# Patient Record
Sex: Female | Born: 1980 | Race: White | Hispanic: No | Marital: Married | Smoking: Never smoker
Health system: Southern US, Community
[De-identification: ages and names within clinical notes are randomized; demographics above are authoritative.]

## PROBLEM LIST (undated history)

## (undated) HISTORY — PX: APPENDECTOMY: SHX54

## (undated) HISTORY — PX: TONSILLECTOMY: SUR1361

## (undated) HISTORY — PX: DILATION AND CURETTAGE OF UTERUS: SHX78

---

## 2006-10-12 DIAGNOSIS — D689 Coagulation defect, unspecified: Secondary | ICD-10-CM | POA: Insufficient documentation

## 2020-10-12 ENCOUNTER — Other Ambulatory Visit: Payer: Self-pay

## 2020-10-12 ENCOUNTER — Encounter: Payer: Self-pay | Admitting: Podiatry

## 2020-10-12 ENCOUNTER — Ambulatory Visit: Payer: Managed Care, Other (non HMO) | Admitting: Podiatry

## 2020-10-12 DIAGNOSIS — L600 Ingrowing nail: Secondary | ICD-10-CM | POA: Diagnosis not present

## 2020-10-12 MED ORDER — GENTAMICIN SULFATE 0.1 % EX CREA: 1.0000 "application " | TOPICAL_CREAM | Freq: Two times a day (BID) | CUTANEOUS | 1 refills | Status: DC

## 2020-10-12 MED ORDER — DOXYCYCLINE HYCLATE 100 MG PO TABS: 100.0000 mg | ORAL_TABLET | Freq: Two times a day (BID) | ORAL | 0 refills | Status: DC

## 2020-10-12 NOTE — Patient Instructions (Signed)

## 2020-10-12 NOTE — Progress Notes (Signed)
   Subjective: Patient presents today for evaluation of pain to the medial border right great toe. Patient is concerned for possible ingrown nail. Patient presents today for further treatment and evaluation.  No past medical history on file.  Objective:  General: Well developed, nourished, in no acute distress, alert and oriented x3   Dermatology: Skin is warm, dry and supple bilateral. Medial border right great toe appears to be erythematous with evidence of an ingrowing nail. Pain on palpation noted to the border of the nail fold. The remaining nails appear unremarkable at this time. There are no open sores, lesions.  Vascular: Dorsalis Pedis artery and Posterior Tibial artery pedal pulses palpable. No lower extremity edema noted.   Neruologic: Grossly intact via light touch bilateral.  Musculoskeletal: Muscular strength within normal limits in all groups bilateral. Normal range of motion noted to all pedal and ankle joints.   Assesement: #1 Paronychia with ingrowing nail medial border right greater #2 Pain in toe  Plan of Care:  1. Patient evaluated.  2. Discussed treatment alternatives and plan of care. Explained nail avulsion procedure and post procedure course to patient. 3. Patient opted for permanent partial nail avulsion of the medial border right greater.  4. Prior to procedure, local anesthesia infiltration utilized using 3 ml of a 50:50 mixture of 2% plain lidocaine and 0.5% plain marcaine in a normal hallux block fashion and a betadine prep performed.  5. Partial permanent nail avulsion with chemical matrixectomy performed using 3x30sec applications of phenol followed by alcohol flush.  6. Light dressing applied. 7. Prescription for gentamicin cream applied daily  8. Prescription for doxycycline 100 mg 2 times daily #20  9. Return to clinic 2 weeks.  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  Felecia Shelling, DPM [facility]  Dr.  Felecia Shelling, DPM    [address] [place], [place] [address]                Office [phone]  Fax [phone]

## 2020-10-26 ENCOUNTER — Other Ambulatory Visit: Payer: Self-pay

## 2020-10-26 ENCOUNTER — Encounter: Payer: Self-pay | Admitting: Podiatry

## 2020-10-26 ENCOUNTER — Ambulatory Visit: Payer: Managed Care, Other (non HMO) | Admitting: Podiatry

## 2020-10-26 DIAGNOSIS — L600 Ingrowing nail: Secondary | ICD-10-CM

## 2020-10-28 NOTE — Progress Notes (Signed)
   Subjective: 40 y.o. female presents today status post permanent nail avulsion procedure of the medial border right great toe that was performed on 10/12/2020.  Patient states that she is feeling well.  There is improvement.  No new complaints at this time  No past medical history on file.  Objective: Skin is warm, dry and supple. Nail and respective nail fold appears to be healing appropriately. Open wound to the associated nail fold with a granular wound base and moderate amount of fibrotic tissue. Minimal drainage noted. Mild erythema around the periungual region likely due to phenol chemical matricectomy.  Assessment: #1 postop permanent partial nail avulsion medial border right great toe #2 open wound periungual nail fold of respective digit.   Plan of care: #1 patient was evaluated  #2 debridement of open wound was performed to the periungual border of the respective toe using a currette. Antibiotic ointment and Band-Aid was applied. #3 patient is to return to clinic on a PRN basis.  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 Felecia Shelling, DPM    [address]                                        [place], [place] [address]                Office [phone]  Fax [phone]

## 2020-10-30 ENCOUNTER — Ambulatory Visit: Payer: Managed Care, Other (non HMO) | Admitting: Podiatry

## 2020-12-03 ENCOUNTER — Encounter: Payer: Managed Care, Other (non HMO) | Admitting: Obstetrics and Gynecology

## 2020-12-06 ENCOUNTER — Other Ambulatory Visit: Payer: Self-pay

## 2020-12-06 ENCOUNTER — Ambulatory Visit (INDEPENDENT_AMBULATORY_CARE_PROVIDER_SITE_OTHER): Payer: Managed Care, Other (non HMO) | Admitting: Advanced Practice Midwife

## 2020-12-06 ENCOUNTER — Encounter: Payer: Self-pay | Admitting: Advanced Practice Midwife

## 2020-12-06 VITALS — BP 104/65 | Ht 61.0 in | Wt 110.0 lb

## 2020-12-06 DIAGNOSIS — Z01419 Encounter for gynecological examination (general) (routine) without abnormal findings: Secondary | ICD-10-CM | POA: Diagnosis not present

## 2020-12-06 DIAGNOSIS — N6323 Unspecified lump in the left breast, lower outer quadrant: Secondary | ICD-10-CM

## 2020-12-06 DIAGNOSIS — Z124 Encounter for screening for malignant neoplasm of cervix: Secondary | ICD-10-CM

## 2020-12-06 NOTE — Progress Notes (Signed)
Gynecology Annual Exam   PCP: Patient, No Pcp Per (Inactive)  Chief Complaint:  Chief Complaint  Patient presents with  . Breast Mass  . Annual Exam    History of Present Illness: Patient is a 40 y.o. G3P3003 presents for annual exam. The patient has complaint today of new breast lump in her left breast. She noticed the lump 3-4 weeks ago and it was tender at the time- less tender now although she is near her next period and has some general breast tenderness associated with that. She has no significant family history of breast or ovarian cancer.  The patient is new to [place] and previously had care in [place]. She has a history of normal PAP smears.   LMP: Patient's last menstrual period was 11/07/2020. Average Interval: regular, 28 days Duration of flow: 3-5 days Heavy Menses: no Clots: no Intermenstrual Bleeding: no Postcoital Bleeding: no Dysmenorrhea: no  The patient is sexually active. She currently uses vasectomy for contraception. She denies dyspareunia.  The patient does perform self breast exams.    The patient wears seatbelts: yes.   The patient has regular exercise: she lives an active lifestyle generally, she admits healthy diet, hydration and sleep.    The patient denies current symptoms of depression.    Review of Systems: Review of Systems  Constitutional: Negative for chills and fever.  HENT: Negative for congestion, ear discharge, ear pain, hearing loss, sinus pain and sore throat.   Eyes: Negative for blurred vision and double vision.  Respiratory: Negative for cough, shortness of breath and wheezing.   Cardiovascular: Negative for chest pain, palpitations and leg swelling.  Gastrointestinal: Negative for abdominal pain, blood in stool, constipation, diarrhea, heartburn, melena, nausea and vomiting.  Genitourinary: Negative for dysuria, flank pain, frequency, hematuria and urgency.  Musculoskeletal: Negative for back pain, joint pain and myalgias.   Skin: Negative for itching and rash.  Neurological: Negative for dizziness, tingling, tremors, sensory change, speech change, focal weakness, seizures, loss of consciousness, weakness and headaches.  Endo/Heme/Allergies: Negative for environmental allergies. Does not bruise/bleed easily.  Psychiatric/Behavioral: Negative for depression, hallucinations, memory loss, substance abuse and suicidal ideas. The patient is not nervous/anxious and does not have insomnia.   Breast: Positive for left breast lump  Past Medical History:  Patient Active Problem List   Diagnosis Date Noted  . 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 No pertinent surgical history.  Gynecologic History:  Patient's last menstrual period was 11/07/2020. Contraception: vasectomy Last Pap: 2 years ago in [place] Results were: no abnormalities per patient report  Obstetric History: G3P3003  Family History:  Family History  Problem Relation Age of Onset  . Heart disease Mother   . Heart disease Maternal Grandfather     Social History:  Social History   Socioeconomic History  . Marital status: Married    Spouse name: Not on file  . Number of children: Not on file  . Years of education: Not on file  . Highest education level: Not on file  Occupational History  . Not on file  Tobacco Use  . Smoking status: Never Smoker  . Smokeless tobacco: Never Used  Substance and Sexual Activity  . Alcohol use: Never  . Drug use: Never  . Sexual activity: Yes    Birth control/protection: None  Other Topics Concern  . Not on file  Social History Narrative  . Not on file   Social Determinants of Health  Financial Resource Strain: Not on file  Food Insecurity: Not on file  Transportation Needs: Not on file  Physical Activity: Not on file  Stress: Not on file  Social Connections: Not on file  Intimate Partner Violence: Not on file    Allergies:  No Known  Allergies  Medications: Prior to Admission medications   Medication Sig Start Date End Date Taking? Authorizing Provider  prochlorperazine (COMPAZINE) 5 MG tablet  02/09/18  Yes [provider]    Physical Exam Vitals: Blood pressure 104/65, height 5\' 1"  (1.549 m), weight 110 lb (49.9 kg), last menstrual period 11/07/2020.  General: NAD HEENT: normocephalic, anicteric Thyroid: no enlargement, no palpable nodules Pulmonary: No increased work of breathing, CTAB Cardiovascular: RRR, distal pulses 2+ Breast: Breast symmetrical, mildly tender, Right breast dense/fibrocystic breast tissue, Left breast dense/fibrocystic with very small nodule at 3 o:clock 9 cm from the nipple approximately 0.5 cm round and mobile, no skin or nipple retraction present, no nipple discharge.  No axillary or supraclavicular lymphadenopathy. Abdomen: NABS, soft, non-tender, non-distended.  Umbilicus without lesions.  No hepatomegaly, splenomegaly or masses palpable. No evidence of hernia  Genitourinary:  External: Normal external female genitalia.  Normal urethral meatus, normal Bartholin's and Skene's glands.    Vagina: Normal vaginal mucosa, no evidence of prolapse.    Cervix: Grossly normal in appearance, friable with PAP, no CMT  Uterus: Non-enlarged, mobile, normal contour.    Adnexa: ovaries non-enlarged, no adnexal masses  Rectal: deferred  Lymphatic: no evidence of inguinal lymphadenopathy Extremities: no edema, erythema, or tenderness Neurologic: Grossly intact Psychiatric: mood appropriate, affect full   Assessment: 40 y.o. G3P3003 routine annual exam  Plan: Problem List Items Addressed This Visit   None   Visit Diagnoses    Well woman exam with routine gynecological exam    -  Primary   Relevant Orders   IGP, Aptima HPV   Screening for cervical cancer       Relevant Orders   IGP, Aptima HPV   Lump in lower outer quadrant of left breast       Relevant Orders   MM DIAG BREAST TOMO  BILATERAL   24 BREAST LTD UNI LEFT INC AXILLA      ) STI screening  was offered and declined  2)  ASCCP guidelines and rationale discussed.  Patient prefers yearly. PAP today and may be ok with every 3 years going forward.  3) Contraception - the patient is currently using  vasectomy.  She is happy with her current form of contraception and plans to continue  4) Routine healthcare maintenance including cholesterol, diabetes screening discussed Declines   5) Left Breast lump: diagnostic mammogram/ultrasound ordered  5) Return in about 1 year (around 12/06/2021) for annual established gyn.   12/08/2021, CNM [facility] 12/06/2020, 9:32 AM

## 2020-12-06 NOTE — Patient Instructions (Signed)
Pap Test Why am I having this test? A Pap test, also called a Pap smear, is a screening test to check for signs of:  Cancer of the vagina, cervix, and uterus. The cervix is the lower part of the uterus that opens into the vagina.  Infection.  Changes that may be a sign that cancer is developing (precancerous changes). Women need this test on a regular basis. In general, you should have a Pap test every 3 years until you reach menopause or age 39. Women aged 30-60 may choose to have their Pap test done at the same time as an HPV (human papillomavirus) test every 5 years (instead of every 3 years). Your health care provider may recommend having Pap tests more or less often depending on your medical conditions and past Pap test results. What kind of sample is taken? Your health care provider will collect a sample of cells from the surface of your cervix. This will be done using a small cotton swab, plastic spatula, or brush. This sample is often collected during a pelvic exam, when you are lying on your back on an exam table with feet in footrests (stirrups). In some cases, fluids (secretions) from the cervix or vagina may also be collected.   How do I prepare for this test?  Be aware of where you are in your menstrual cycle. If you are menstruating on the day of the test, you may be asked to reschedule.  You may need to reschedule if you have a known vaginal infection on the day of the test.  Follow instructions from your health care provider about: ? Changing or stopping your regular medicines. Some medicines can cause abnormal test results, such as digitalis and tetracycline. ? Avoiding douching or taking a bath the day before or the day of the test. Tell a health care provider about:  Any allergies you have.  All medicines you are taking, including vitamins, herbs, eye drops, creams, and over-the-counter medicines.  Any blood disorders you have.  Any surgeries you have had.  Any  medical conditions you have.  Whether you are pregnant or may be pregnant. How are the results reported? Your test results will be reported as either abnormal or normal. A false-positive result can occur. A false positive is incorrect because it means that a condition is present when it is not. A false-negative result can occur. A false negative is incorrect because it means that a condition is not present when it is. What do the results mean? A normal test result means that you do not have signs of cancer of the vagina, cervix, or uterus. An abnormal result may mean that you have:  Cancer. A Pap test by itself is not enough to diagnose cancer. You will have more tests done in this case.  Precancerous changes in your vagina, cervix, or uterus.  Inflammation of the cervix.  An STD (sexually transmitted disease).  A fungal infection.  A parasite infection. Talk with your health care provider about what your results mean. Questions to ask your health care provider Ask your health care provider, or the department that is doing the test:  When will my results be ready?  How will I get my results?  What are my treatment options?  What other tests do I need?  What are my next steps? Summary  In general, women should have a Pap test every 3 years until they reach menopause or age 29.  Your health care provider will collect  a sample of cells from the surface of your cervix. This will be done using a small cotton swab, plastic spatula, or brush.  In some cases, fluids (secretions) from the cervix or vagina may also be collected. This information is not intended to replace advice given to you by your health care provider. Make sure you discuss any questions you have with your health care provider. Document Revised: 04/18/2020 Document Reviewed: 04/13/2020 Elsevier Patient Education  2021 Elsevier Inc. Health Maintenance, Female Adopting a healthy lifestyle and getting preventive  care are important in promoting health and wellness. Ask your health care provider about:  The right schedule for you to have regular tests and exams.  Things you can do on your own to prevent diseases and keep yourself healthy. What should I know about diet, weight, and exercise? Eat a healthy diet  Eat a diet that includes plenty of vegetables, fruits, low-fat dairy products, and lean protein.  Do not eat a lot of foods that are high in solid fats, added sugars, or sodium.   Maintain a healthy weight Body mass index (BMI) is used to identify weight problems. It estimates body fat based on height and weight. Your health care provider can help determine your BMI and help you achieve or maintain a healthy weight. Get regular exercise Get regular exercise. This is one of the most important things you can do for your health. Most adults should:  Exercise for at least 150 minutes each week. The exercise should increase your heart rate and make you sweat (moderate-intensity exercise).  Do strengthening exercises at least twice a week. This is in addition to the moderate-intensity exercise.  Spend less time sitting. Even light physical activity can be beneficial. Watch cholesterol and blood lipids Have your blood tested for lipids and cholesterol at 39 years of age, then have this test every 5 years. Have your cholesterol levels checked more often if:  Your lipid or cholesterol levels are high.  You are older than 39 years of age.  You are at high risk for heart disease. What should I know about cancer screening? Depending on your health history and family history, you may need to have cancer screening at various ages. This may include screening for:  Breast cancer.  Cervical cancer.  Colorectal cancer.  Skin cancer.  Lung cancer. What should I know about heart disease, diabetes, and high blood pressure? Blood pressure and heart disease  High blood pressure causes heart disease  and increases the risk of stroke. This is more likely to develop in people who have high blood pressure readings, are of African descent, or are overweight.  Have your blood pressure checked: ? Every 3-5 years if you are 28-96 years of age. ? Every year if you are 59 years old or older. Diabetes Have regular diabetes screenings. This checks your fasting blood sugar level. Have the screening done:  Once every three years after age 46 if you are at a normal weight and have a low risk for diabetes.  More often and at a younger age if you are overweight or have a high risk for diabetes. What should I know about preventing infection? Hepatitis B If you have a higher risk for hepatitis B, you should be screened for this virus. Talk with your health care provider to find out if you are at risk for hepatitis B infection. Hepatitis C Testing is recommended for:  Everyone born from [birth year] through 1964.  Anyone with known risk factors for  hepatitis C. Sexually transmitted infections (STIs)  Get screened for STIs, including gonorrhea and chlamydia, if: ? You are sexually active and are younger than 39 years of age. ? You are older than 39 years of age and your health care provider tells you that you are at risk for this type of infection. ? Your sexual activity has changed since you were last screened, and you are at increased risk for chlamydia or gonorrhea. Ask your health care provider if you are at risk.  Ask your health care provider about whether you are at high risk for HIV. Your health care provider may recommend a prescription medicine to help prevent HIV infection. If you choose to take medicine to prevent HIV, you should first get tested for HIV. You should then be tested every 3 months for as long as you are taking the medicine. Pregnancy  If you are about to stop having your period (premenopausal) and you may become pregnant, seek counseling before you get pregnant.  Take 400 to 800  micrograms (mcg) of folic acid every day if you become pregnant.  Ask for birth control (contraception) if you want to prevent pregnancy. Osteoporosis and menopause Osteoporosis is a disease in which the bones lose minerals and strength with aging. This can result in bone fractures. If you are 65 years old or older, or if you are at risk for osteoporosis and fractures, ask your health care provider if you should:  Be screened for bone loss.  Take a calcium or vitamin D supplement to lower your risk of fractures.  Be given hormone replacement therapy (HRT) to treat symptoms of menopause. Follow these instructions at home: Lifestyle  Do not use any products that contain nicotine or tobacco, such as cigarettes, e-cigarettes, and chewing tobacco. If you need help quitting, ask your health care provider.  Do not use street drugs.  Do not share needles.  Ask your health care provider for help if you need support or information about quitting drugs. Alcohol use  Do not drink alcohol if: ? Your health care provider tells you not to drink. ? You are pregnant, may be pregnant, or are planning to become pregnant.  If you drink alcohol: ? Limit how much you use to 0-1 drink a day. ? Limit intake if you are breastfeeding.  Be aware of how much alcohol is in your drink. In the [place] one drink equals one 12 oz bottle of beer (355 mL), one 5 oz glass of wine (148 mL), or one 1 oz glass of hard liquor (44 mL). General instructions  Schedule regular health, dental, and eye exams.  Stay current with your vaccines.  Tell your health care provider if: ? You often feel depressed. ? You have ever been abused or do not feel safe at home. Summary  Adopting a healthy lifestyle and getting preventive care are important in promoting health and wellness.  Follow your health care provider's instructions about healthy diet, exercising, and getting tested or screened for diseases.  Follow your health  care provider's instructions on monitoring your cholesterol and blood pressure. This information is not intended to replace advice given to you by your health care provider. Make sure you discuss any questions you have with your health care provider. Document Revised: 08/04/2018 Document Reviewed: 08/04/2018 Elsevier Patient Education  2021 Elsevier Inc.  

## 2020-12-11 LAB — IGP, APTIMA HPV
HPV Aptima: NEGATIVE
PAP Smear Comment: 0

## 2021-01-15 ENCOUNTER — Other Ambulatory Visit: Payer: Self-pay

## 2021-01-15 ENCOUNTER — Ambulatory Visit
Admission: RE | Admit: 2021-01-15 | Discharge: 2021-01-15 | Disposition: A | Discharge status: Home / Self Care | Payer: Managed Care, Other (non HMO) | Source: Ambulatory Visit | Attending: Advanced Practice Midwife | Admitting: Advanced Practice Midwife

## 2021-01-15 DIAGNOSIS — N6323 Unspecified lump in the left breast, lower outer quadrant: Secondary | ICD-10-CM | POA: Insufficient documentation

## 2022-07-07 ENCOUNTER — Ambulatory Visit
Admission: EM | Admit: 2022-07-07 | Discharge: 2022-07-07 | Disposition: A | Discharge status: Home / Self Care | Payer: Managed Care, Other (non HMO) | Attending: Internal Medicine | Admitting: Internal Medicine

## 2022-07-07 ENCOUNTER — Encounter: Payer: Self-pay | Admitting: Emergency Medicine

## 2022-07-07 DIAGNOSIS — R197 Diarrhea, unspecified: Secondary | ICD-10-CM

## 2022-07-07 DIAGNOSIS — J029 Acute pharyngitis, unspecified: Secondary | ICD-10-CM | POA: Diagnosis present

## 2022-07-07 LAB — GROUP A STREP BY PCR: Group A Strep by PCR: NOT DETECTED

## 2022-07-07 NOTE — ED Provider Notes (Signed)
[facility]    CSN: [id number] Arrival date & time: 07/07/22  1548      History   Chief Complaint Chief Complaint  Patient presents with   Sore Throat   Diarrhea   Fever    HPI Yvonne Evans is a 41 y.o. female who presents with onset of ST, diarrhea and fever up to  102  2 days ago. No fever this am. Has had today 4 diarrhea BM's in the past 2 days. Denies body aches or HA, N/V. Her appetite is decreased. She declined covid or flu test.     History reviewed. No pertinent past medical history.  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 Authorizing Provider  prochlorperazine (COMPAZINE) 5 MG tablet  02/09/18   [provider]    Family History Family History  Problem Relation Age of Onset   Heart disease Mother    Heart disease Maternal Grandfather     Social History Social History   Tobacco Use   Smoking status: Never   Smokeless tobacco: Never  Substance Use Topics   Alcohol use: Never   Drug use: Never     Allergies   Patient has no known allergies.   Review of Systems Review of Systems  Constitutional:  Positive for appetite change.  HENT:  Positive for sore throat. Negative for congestion, ear discharge, ear pain, postnasal drip and rhinorrhea.   Respiratory:  Negative for cough.   Gastrointestinal:  Positive for diarrhea. Negative for nausea and vomiting.     Physical Exam Triage Vital Signs ED Triage Vitals  Enc Vitals Group     BP 07/07/22 1645 116/83      Pulse Rate 07/07/22 1645 76     Resp 07/07/22 1645 16     Temp 07/07/22 1645 99 F (37.2 C)     Temp Source 07/07/22 1645 Oral     SpO2 07/07/22 1645 100 %     Weight --      Height --      Head Circumference --      Peak Flow --      Pain Score 07/07/22 1643 6     Pain Loc --      Pain Edu? --      Excl. in GC? --    No data found.  Updated Vital Signs BP 116/83 (BP Location: Right Arm)   Pulse 76   Temp 99 F (37.2 C) (Oral)   Resp 16   LMP 06/23/2022 (Approximate)   SpO2 100%   Visual Acuity Right Eye Distance:   Left Eye Distance:   Bilateral Distance:    Right Eye Near:   Left Eye Near:  Bilateral Near:     Physical Exam Physical Exam Vitals signs and nursing note reviewed.  Constitutional:      General: She is not in acute distress.    Appearance: Normal appearance. She is not ill-appearing, toxic-appearing or diaphoretic.  HENT:     Head: Normocephalic.     Right Ear: Tympanic membrane, ear canal and external ear normal.     Left Ear: Tympanic membrane, ear canal and external ear normal.     Nose: Nose normal.     Mouth/Throat: mild erythema, no exudates, uvula is mid line.     Mouth: Mucous membranes are moist.  Eyes:     General: No scleral icterus.       Right eye: No discharge.        Left eye: No discharge.     Conjunctiva/sclera: Conjunctivae normal.  Neck:     Musculoskeletal: Neck supple. No neck rigidity.  Cardiovascular:     Rate and Rhythm: Normal rate and regular rhythm.     Heart sounds: No murmur.  Pulmonary:     Effort: Pulmonary effort is normal.     Breath sounds: Normal breath sounds.  Abdominal:     General: Bowel sounds are normal. There is no distension.     Palpations: Abdomen is soft. There is no mass.     Tenderness: There is no abdominal tenderness. There is no guarding or rebound.     Hernia: No hernia is present.  Musculoskeletal: Normal range of motion.  Lymphadenopathy:     Cervical: No cervical adenopathy.   Skin:    General: Skin is warm and dry.     Coloration: Skin is not jaundiced.     Findings: No rash.  Neurological:     Mental Status: She is alert and oriented to person, place, and time.     Gait: Gait normal.  Psychiatric:        Mood and Affect: Mood normal.        Behavior: Behavior normal.        Thought Content: Thought content normal.        Judgment: Judgment normal.    UC Treatments / Results  Labs (all labs ordered are listed, but only abnormal results are displayed) Labs Reviewed  GROUP A STREP BY PCR    EKG   Radiology No results found.  Procedures Procedures (including critical care time)  Medications Ordered in UC Medications - No data to display  Initial Impression / Assessment and Plan / UC Course  I have reviewed the triage vital signs and the nursing notes.  Pertinent labs  results that were available during my care of the patient were reviewed by me and considered in my medical decision making (see chart for details).  Viral pharyngitis Diarrhea  Suportice care advised.  May continue BRAT diet  Final Clinical Impressions(s) / UC Diagnoses   Final diagnoses:  Viral upper respiratory tract infection  Diarrhea, unspecified type  Viral pharyngitis     Discharge Instructions      You may take the following supplements to help your immune system be stronger to fight this viral infection Take Quarcetin 500 mg three times a day x 7 days with Zinc 50 mg ones a day x 7 days. The quarcetin is an antiviral and anti-inflammatory supplement which helps open the zinc channels in the cell to absorb Zinc. Zinc helps decrease the virus load in your body. Take Melatonin 6-10 mg at bed time which also helps  support your immune system.  Also make sure to take Vit D 5,000 IU per day with a fatty meal and Vit C 5000 mg a day until you are completely better. To prevent viral illnesses your vitamin D should be between 60-80. Stay on Vitamin D 2,000  and C   1000 mg the rest of the season.       ED Prescriptions   None    PDMP not reviewed this encounter.   Garey Ham, Cordelia Poche 07/07/22 2017

## 2022-07-07 NOTE — ED Triage Notes (Signed)
Pt presents with sore throat, diarrhea, and fever x 2 days.

## 2022-07-07 NOTE — Discharge Instructions (Addendum)
You may take the following supplements to help your immune system be stronger to fight this viral infection Take Quarcetin 500 mg three times a day x 7 days with Zinc 50 mg ones a day x 7 days. The quarcetin is an antiviral and anti-inflammatory supplement which helps open the zinc channels in the cell to absorb Zinc. Zinc helps decrease the virus load in your body. Take Melatonin 6-10 mg at bed time which also helps support your immune system.  Also make sure to take Vit D 5,000 IU per day with a fatty meal and Vit C 5000 mg a day until you are completely better. To prevent viral illnesses your vitamin D should be between 60-80. Stay on Vitamin D 2,000  and C  1000 mg the rest of the season.

## 2022-08-09 ENCOUNTER — Inpatient Hospital Stay
Admission: RE | Admit: 2022-08-09 | Discharge: 2022-08-09 | Disposition: A | Discharge status: Home / Self Care | Payer: Managed Care, Other (non HMO) | Source: Ambulatory Visit

## 2022-08-29 ENCOUNTER — Ambulatory Visit: Payer: Managed Care, Other (non HMO) | Admitting: Obstetrics & Gynecology

## 2022-08-31 IMAGING — MG DIGITAL DIAGNOSTIC BILAT W/ TOMO W/ CAD
8 of 16 series · 8 of 40 positions shown · non-contrast
Comparison: Previous exam(s).

CLINICAL DATA: Palpable mass in the LEFT breast. Patient first
noted mass in October 2020. Mass is unchanged since first found.

EXAM:
DIGITAL DIAGNOSTIC BILATERAL MAMMOGRAM WITH TOMOSYNTHESIS AND CAD;
ULTRASOUND LEFT BREAST LIMITED
TECHNIQUE: Bilateral digital diagnostic mammography and breast tomosynthesis
was performed. The images were evaluated with computer-aided
detection.; Targeted ultrasound examination of the left breast was
performed

[L CC synth-2D (1 of 2)]
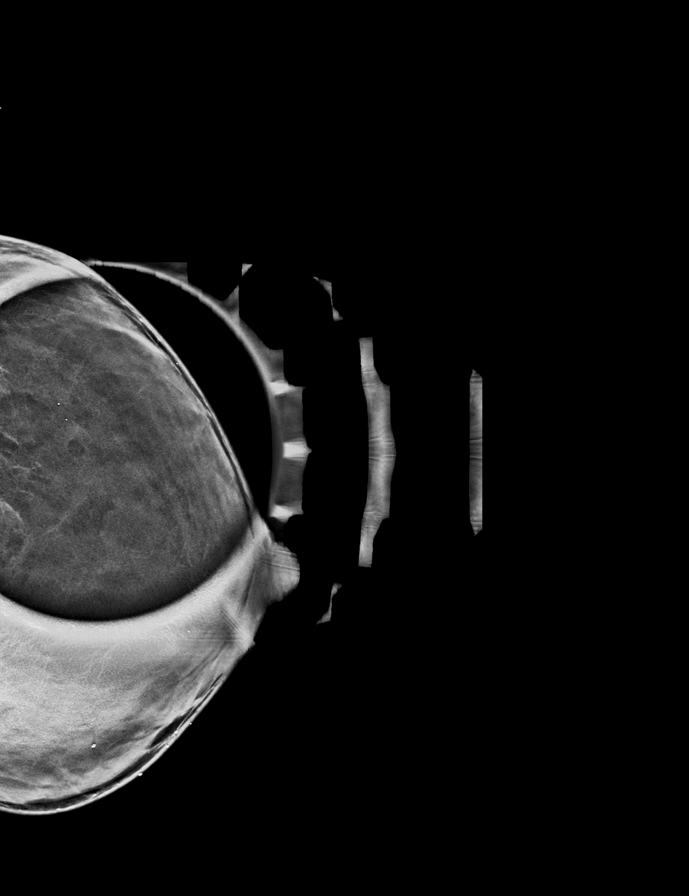

[L MLO synth-2D (1 of 3)]
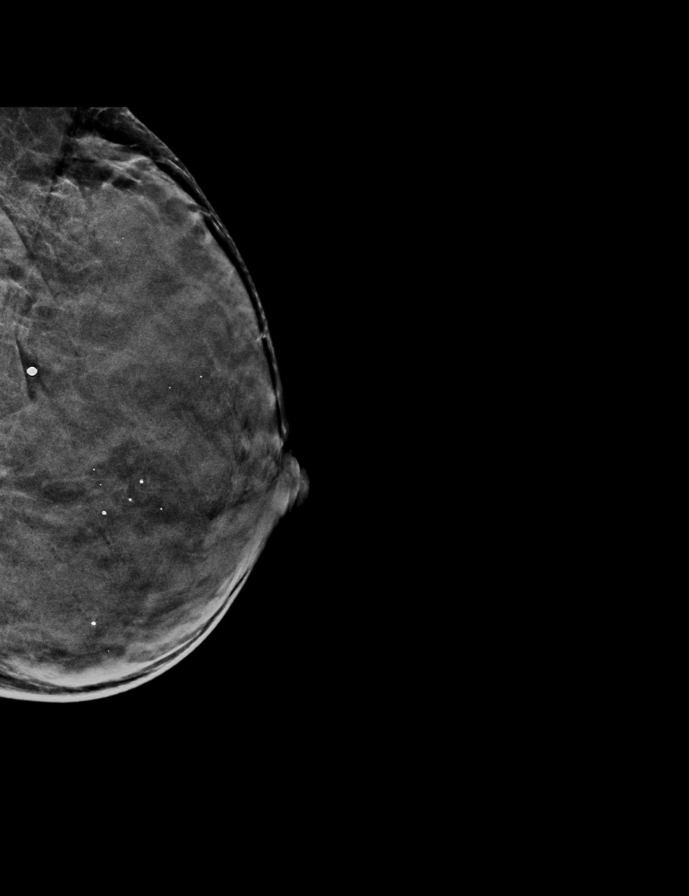

[L MLO synth-2D (2 of 3)]
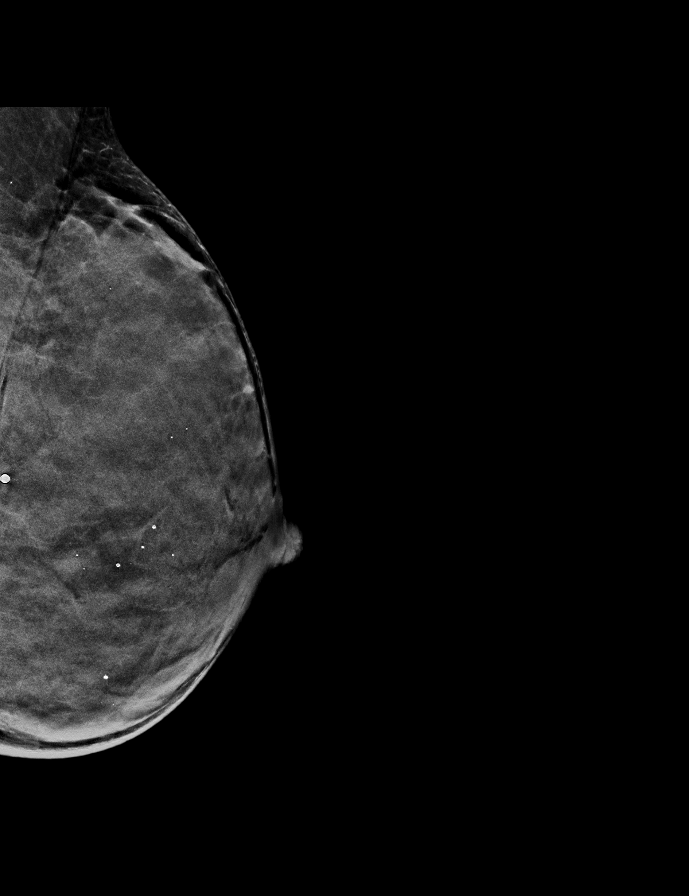

[L CC synth-2D (2 of 2)]
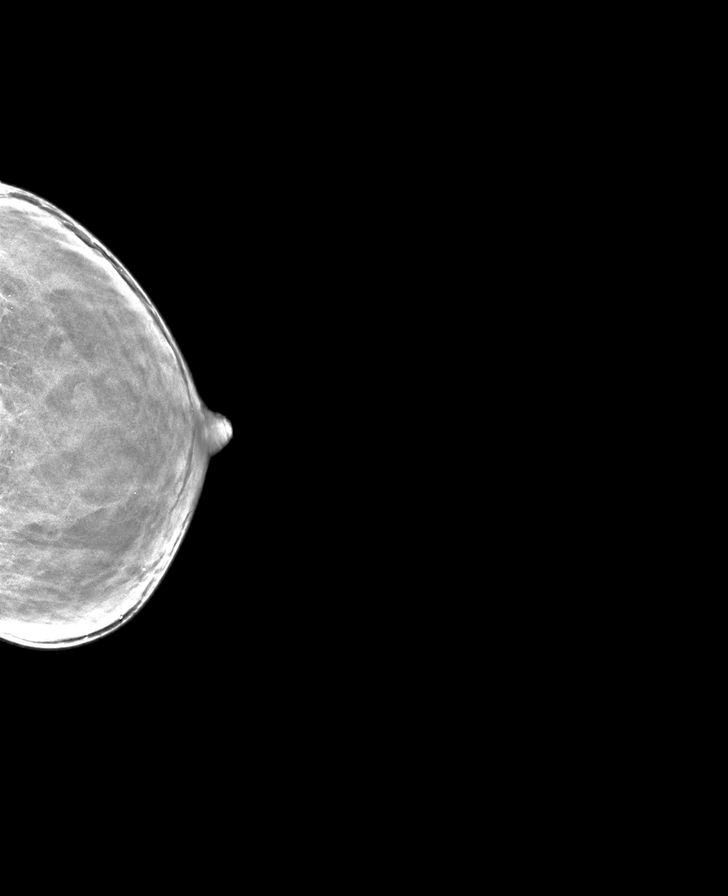

[R CC synth-2D]
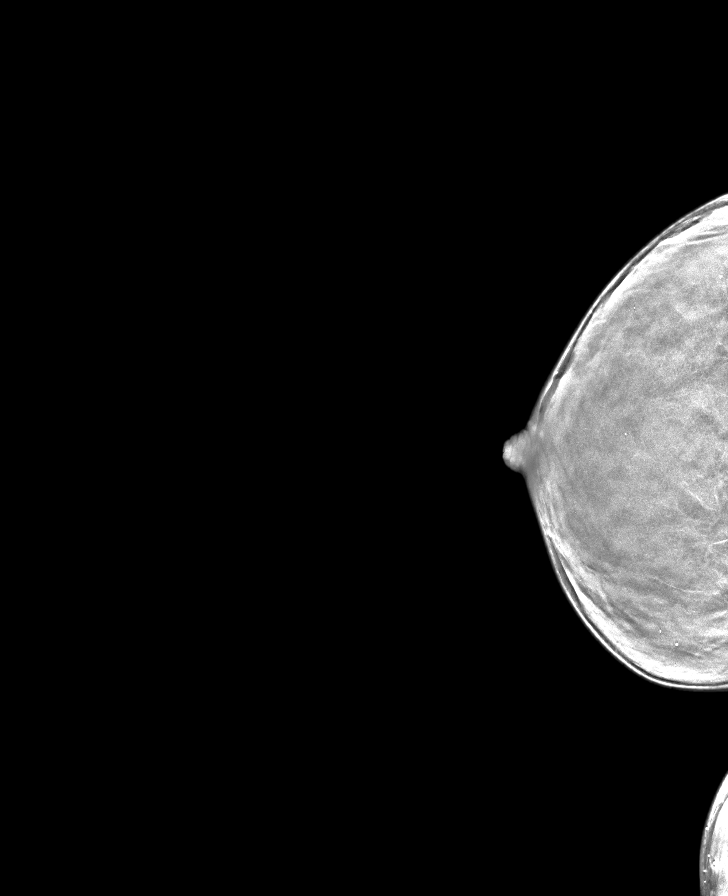

[L MLO synth-2D (3 of 3)]
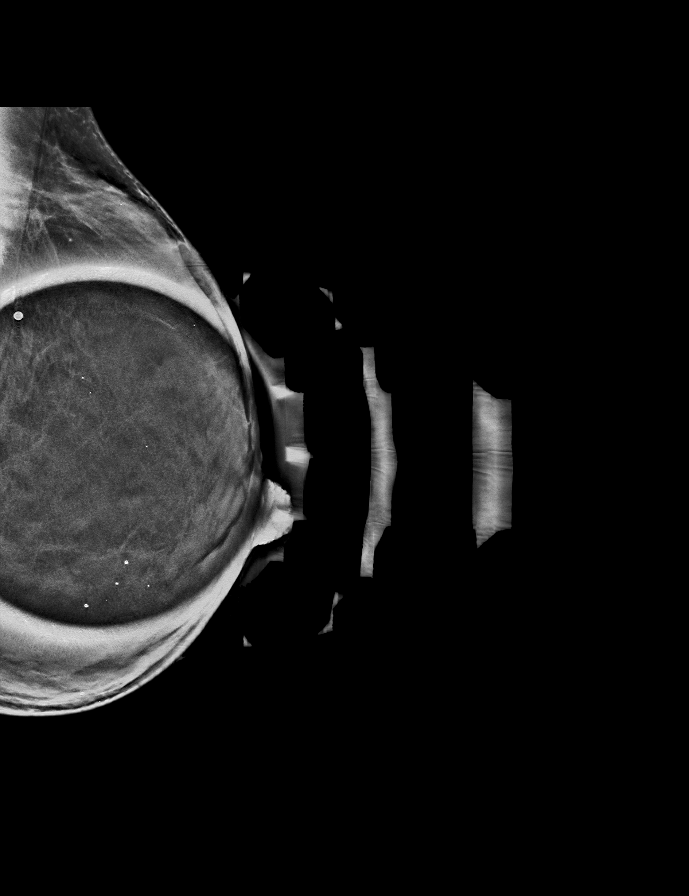

[R MLO synth-2D]
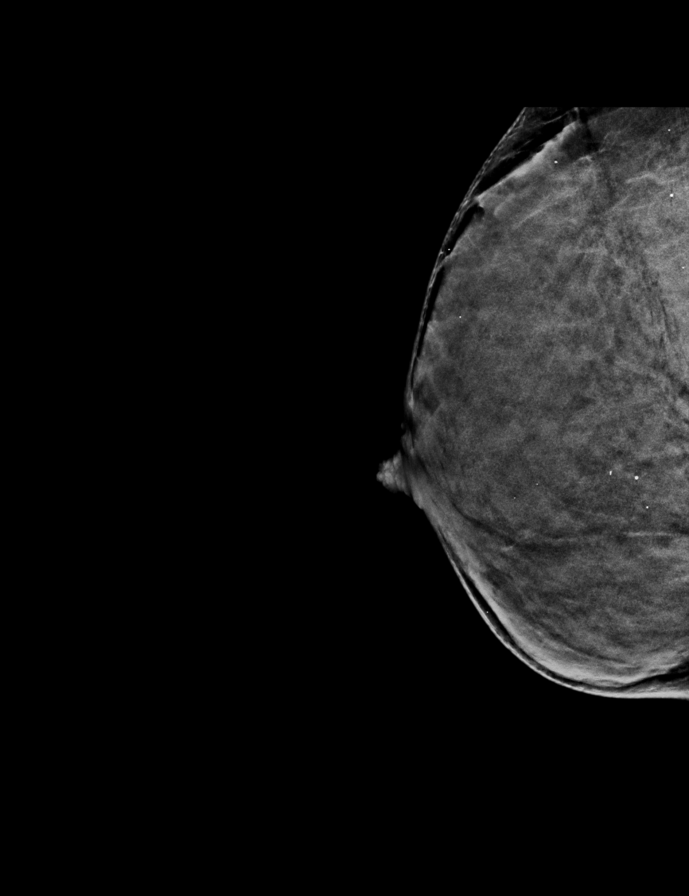

[L TAN tomo · tomo slice 16/31.0]
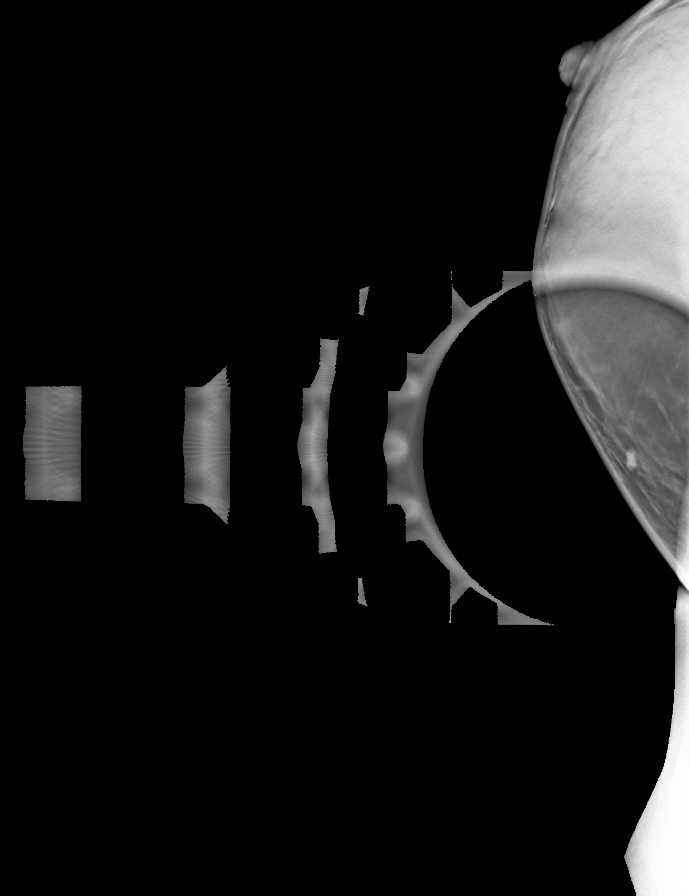

[8 of 40 positions shown; findings below may reference images not displayed]

ACR Breast Density Category d: The breast tissue is extremely dense,
which lowers the sensitivity of mammography.
FINDINGS: Possible partially obscured mass in the UPPER-OUTER QUADRANT of the
LEFT breast is confirmed on spot compression views. BB marks area of
patient's concern which corresponds to an oval mass best seen on the
exaggerated craniocaudal projection. RIGHT breast is negative.

On physical exam, I palpated have mobile superficial mass in the 3
o'clock location of the LEFT breast.

Targeted ultrasound is performed, showing a benign cyst in the 3
o'clock location 8 centimeters from the nipple corresponding to the
area of concern. This cyst is 0.7 x 0.4 x 0.6 centimeters.

Additional benign cysts are identified in the UPPER-OUTER QUADRANT
of the LEFT breast. No solid mass or acoustic shadowing.
IMPRESSION: Simple cysts in the LEFT breast. No mammographic or ultrasound
evidence for malignancy.

RECOMMENDATION:
Screening mammogram in one year.(Code:4T-U-PM5)

I have discussed the findings and recommendations with the patient.
If applicable, a reminder letter will be sent to the patient
regarding the next appointment.

BI-RADS CATEGORY  2: Benign.

## 2022-09-09 ENCOUNTER — Ambulatory Visit: Payer: Managed Care, Other (non HMO) | Admitting: Obstetrics and Gynecology

## 2022-09-15 ENCOUNTER — Ambulatory Visit (INDEPENDENT_AMBULATORY_CARE_PROVIDER_SITE_OTHER): Payer: Managed Care, Other (non HMO) | Admitting: Obstetrics & Gynecology

## 2022-09-15 ENCOUNTER — Other Ambulatory Visit (HOSPITAL_COMMUNITY)
Admission: RE | Admit: 2022-09-15 | Discharge: 2022-09-15 | Disposition: A | Discharge status: Home / Self Care | Payer: Managed Care, Other (non HMO) | Source: Ambulatory Visit | Attending: Obstetrics & Gynecology | Admitting: Obstetrics & Gynecology

## 2022-09-15 VITALS — BP 118/76 | HR 77 | Resp 16 | Ht 61.0 in | Wt 105.4 lb

## 2022-09-15 DIAGNOSIS — Z124 Encounter for screening for malignant neoplasm of cervix: Secondary | ICD-10-CM

## 2022-09-15 DIAGNOSIS — Z01419 Encounter for gynecological examination (general) (routine) without abnormal findings: Secondary | ICD-10-CM | POA: Diagnosis present

## 2022-09-15 DIAGNOSIS — Z1231 Encounter for screening mammogram for malignant neoplasm of breast: Secondary | ICD-10-CM

## 2022-09-15 DIAGNOSIS — Z01411 Encounter for gynecological examination (general) (routine) with abnormal findings: Secondary | ICD-10-CM | POA: Diagnosis not present

## 2022-09-15 DIAGNOSIS — R232 Flushing: Secondary | ICD-10-CM | POA: Diagnosis not present

## 2022-09-15 NOTE — Progress Notes (Addendum)
GYNECOLOGY ANNUAL PHYSICAL EXAM PROGRESS NOTE  Subjective:    Yvonne Evans is a 42 y.o. married G57P3003 female who presents for an annual exam.  The patient is sexually active. The patient participates in regular exercise: no. Has the patient ever been transfused or tattooed?: no. The patient reports that there is not domestic violence in her life.   The patient has the following complaints today.  She has been having some hot flashes and mood changes. She is also having night sweats. She would like to have labs drawn to check hormones or menopause.  Menstrual History: Menarche age: 67 No LMP recorded.     Gynecologic History:  Contraception: vasectomy History of STI's:  Last Pap: 12/06/2020. Results were: normal.  Denies h/o abnormal pap smears. Last mammogram: 01/15/2021. Results were: normal    OB History  Gravida Para Term Preterm AB Living  3 3 3  0 0 3  SAB IAB Ectopic Multiple Live Births  0 0 0 0 0    # Outcome Date GA Lbr Len/2nd Weight Sex Delivery Anes PTL Lv  3 Term      CS-Unspec     2 Term      Vag-Spont     1 Term      Vag-Spont       No past medical history on file.  Past Surgical History:  Procedure Laterality Date   APPENDECTOMY     CESAREAN SECTION     TONSILLECTOMY      Family History  Problem Relation Age of Onset   Heart disease Mother    Heart disease Maternal Grandfather     Social History   Socioeconomic History   Marital status: Married    Spouse name: Not on file   Number of children: Not on file   Years of education: Not on file   Highest education level: Not on file  Occupational History   Not on file  Tobacco Use   Smoking status: Never   Smokeless tobacco: Never  Substance and Sexual Activity   Alcohol use: Never   Drug use: Never   Sexual activity: Yes    Birth control/protection: None  Other Topics Concern   Not on file  Social History Narrative   Not on file   Social Determinants of Health    Financial Resource Strain: Not on file  Food Insecurity: Not on file  Transportation Needs: Not on file  Physical Activity: Not on file  Stress: Not on file  Social Connections: Not on file  Intimate Partner Violence: Not on file    Current Outpatient Medications on File Prior to Visit  Medication Sig Dispense Refill   prochlorperazine (COMPAZINE) 5 MG tablet      No current facility-administered medications on file prior to visit.    No Known Allergies  She works at [employer]. She has been married for 16 years, denies dyspareunia. She denies a FH of breast, gyn, colon cancer.  Review of Systems Constitutional: negative for chills, fatigue, fevers and sweats Eyes: negative for irritation, redness and visual disturbance Ears, nose, mouth, throat, and face: negative for hearing loss, nasal congestion, snoring and tinnitus Respiratory: negative for asthma, cough, sputum Cardiovascular: negative for chest pain, dyspnea, exertional chest pressure/discomfort, irregular heart beat, palpitations and syncope Gastrointestinal: negative for abdominal pain, change in bowel habits, nausea and vomiting Genitourinary: negative for abnormal menstrual periods, genital lesions, sexual problems and vaginal discharge, dysuria and urinary incontinence Integument/breast: negative for breast lump, breast  tenderness and nipple discharge Hematologic/lymphatic: negative for bleeding and easy bruising Musculoskeletal:negative for back pain and muscle weakness Neurological: negative for dizziness, headaches, vertigo and weakness Endocrine: negative for diabetic symptoms including polydipsia, polyuria and skin dryness Allergic/Immunologic: negative for hay fever and urticaria      Objective:  There were no vitals taken for this visit. There is no height or weight on file to calculate BMI.    General Appearance:    Alert, cooperative, no distress, appears stated age  Head:    Normocephalic, without  obvious abnormality, atraumatic  Eyes:    PERRL, conjunctiva/corneas clear, EOM's intact, both eyes  Ears:    Normal external ear canals, both ears  Nose:   Nares normal, septum midline, mucosa normal, no drainage or sinus tenderness  Throat:   Lips, mucosa, and tongue normal; teeth and gums normal  Neck:   Supple, symmetrical, trachea midline, no adenopathy; thyroid: no enlargement/tenderness/nodules; no carotid bruit or JVD  Back:     Symmetric, no curvature, ROM normal, no CVA tenderness  Lungs:     Clear to auscultation bilaterally, respirations unlabored  Chest Wall:    No tenderness or deformity   Heart:    Regular rate and rhythm, S1 and S2 normal, no murmur, rub or gallop  Breast Exam:    No tenderness, masses, or nipple abnormality  Abdomen:     Soft, non-tender, bowel sounds active all four quadrants, no masses, no organomegaly.    Genitalia:    Pelvic:external genitalia normal, vagina without lesions, discharge, or tenderness, rectovaginal septum  normal. Cervix normal in appearance, no cervical motion tenderness, no adnexal masses or tenderness.  Uterus normal size, shape, mobile, regular contours, nontender.     Extremities:   Extremities normal, atraumatic, no cyanosis or edema  Pulses:   2+ and symmetric all extremities  Skin:   Skin color, texture, turgor normal, no rashes or lesions  Lymph nodes:   Cervical, supraclavicular, and axillary nodes normal  Neurologic:   CNII-XII intact, normal strength, sensation and reflexes throughout   .  Labs:  No results found for: "WBC", "HGB", "HCT", "MCV", "PLT"  No results found for: "CREATININE", "BUN", "NA", "K", "CL", "CO2"  No results found for: "ALT", "AST", "GGT", "ALKPHOS", "BILITOT"  No results found for: "TSH"   Assessment:   1. Well woman exam with routine gynecological exam   2. Encounter for screening mammogram for malignant neoplasm of breast   3. Hot flashes - She has clotting disorders and is NOT a candidate for  any estrogen. I suggested OTC supplements for hot flashes. She declines prescription lexapro/   Plan:  Blood tests: Ordered. Breast self exam technique reviewed and patient encouraged to perform self-exam monthly. Contraception: vasectomy. Discussed healthy lifestyle modifications. Mammogram ordered Pap smear requested per patient, aware of ACOG recs COVID vaccination status: Follow up in 1 year for annual exam   Clovia Cuff, MD [facility]

## 2022-09-15 NOTE — Patient Instructions (Signed)

## 2022-09-15 NOTE — Addendum Note (Signed)
Addended by: Emily Filbert on: 09/15/2022 02:09 PM   Modules accepted: Orders

## 2022-09-16 ENCOUNTER — Encounter: Payer: Self-pay | Admitting: Obstetrics & Gynecology

## 2022-09-16 ENCOUNTER — Other Ambulatory Visit: Payer: Self-pay | Admitting: Obstetrics & Gynecology

## 2022-09-16 LAB — COMPREHENSIVE METABOLIC PANEL
ALT: 11 IU/L (ref 0–32)
AST: 16 IU/L (ref 0–40)
Albumin/Globulin Ratio: 2.1 (ref 1.2–2.2)
Albumin: 4.7 g/dL (ref 3.9–4.9)
Alkaline Phosphatase: 40 IU/L — ABNORMAL LOW (ref 44–121)
BUN/Creatinine Ratio: 13 (ref 9–23)
BUN: 13 mg/dL (ref 6–24)
Bilirubin Total: 0.8 mg/dL (ref 0.0–1.2)
CO2: 21 mmol/L (ref 20–29)
Calcium: 9.6 mg/dL (ref 8.7–10.2)
Chloride: 102 mmol/L (ref 96–106)
Creatinine, Ser: 0.97 mg/dL (ref 0.57–1.00)
Globulin, Total: 2.2 g/dL (ref 1.5–4.5)
Glucose: 71 mg/dL (ref 70–99)
Potassium: 4.1 mmol/L (ref 3.5–5.2)
Sodium: 141 mmol/L (ref 134–144)
Total Protein: 6.9 g/dL (ref 6.0–8.5)
eGFR: 75 mL/min/{1.73_m2} (ref >59)

## 2022-09-16 LAB — VITAMIN D 25 HYDROXY (VIT D DEFICIENCY, FRACTURES): Vit D, 25-Hydroxy: 25.2 ng/mL — ABNORMAL LOW (ref 30.0–100.0)

## 2022-09-16 LAB — LIPID PANEL
Chol/HDL Ratio: 2 ratio (ref 0.0–4.4)
Cholesterol, Total: 154 mg/dL (ref 100–199)
HDL: 76 mg/dL (ref >39)
LDL Chol Calc (NIH): 65 mg/dL (ref 0–99)
Triglycerides: 65 mg/dL (ref 0–149)
VLDL Cholesterol Cal: 13 mg/dL (ref 5–40)

## 2022-09-16 LAB — TSH+FREE T4
Free T4: 1.31 ng/dL (ref 0.82–1.77)
TSH: 2.59 u[IU]/mL (ref 0.450–4.500)

## 2022-09-16 LAB — CBC
Hematocrit: 40 % (ref 34.0–46.6)
Hemoglobin: 13.9 g/dL (ref 11.1–15.9)
MCH: 32.4 pg (ref 26.6–33.0)
MCHC: 34.8 g/dL (ref 31.5–35.7)
MCV: 93 fL (ref 79–97)
Platelets: 203 10*3/uL (ref 150–450)
RBC: 4.29 x10E6/uL (ref 3.77–5.28)
RDW: 12.1 % (ref 11.7–15.4)
WBC: 5.8 10*3/uL (ref 3.4–10.8)

## 2022-09-16 LAB — LUTEINIZING HORMONE: LH: 2 m[IU]/mL

## 2022-09-16 LAB — FOLLICLE STIMULATING HORMONE: FSH: 3.3 m[IU]/mL

## 2022-09-16 MED ORDER — VITAMIN D (ERGOCALCIFEROL) 1.25 MG (50000 UNIT) PO CAPS: 50000.0000 [IU] | ORAL_CAPSULE | ORAL | 0 refills | Status: AC

## 2022-09-16 NOTE — Progress Notes (Signed)
Vitamin d prescribed. Patient notified via [place].

## 2022-09-18 ENCOUNTER — Other Ambulatory Visit: Payer: Self-pay | Admitting: Obstetrics & Gynecology

## 2022-09-18 DIAGNOSIS — Z1231 Encounter for screening mammogram for malignant neoplasm of breast: Secondary | ICD-10-CM

## 2022-09-22 ENCOUNTER — Encounter: Payer: Self-pay | Admitting: Obstetrics & Gynecology

## 2022-09-22 LAB — CYTOLOGY - PAP
Comment: NEGATIVE
Diagnosis: NEGATIVE
High risk HPV: NEGATIVE

## 2022-10-06 ENCOUNTER — Ambulatory Visit
Admission: RE | Admit: 2022-10-06 | Discharge: 2022-10-06 | Disposition: A | Discharge status: Home / Self Care | Payer: Managed Care, Other (non HMO) | Source: Ambulatory Visit | Attending: Obstetrics & Gynecology | Admitting: Obstetrics & Gynecology

## 2022-10-06 DIAGNOSIS — Z1231 Encounter for screening mammogram for malignant neoplasm of breast: Secondary | ICD-10-CM | POA: Diagnosis present

## 2023-11-18 ENCOUNTER — Other Ambulatory Visit: Payer: Self-pay | Admitting: Internal Medicine

## 2023-11-18 DIAGNOSIS — Z1231 Encounter for screening mammogram for malignant neoplasm of breast: Secondary | ICD-10-CM

## 2023-12-03 ENCOUNTER — Other Ambulatory Visit (HOSPITAL_COMMUNITY)
Admission: RE | Admit: 2023-12-03 | Discharge: 2023-12-03 | Disposition: A | Discharge status: Home / Self Care | Source: Ambulatory Visit | Attending: Certified Nurse Midwife | Admitting: Certified Nurse Midwife

## 2023-12-03 ENCOUNTER — Ambulatory Visit
Admission: RE | Admit: 2023-12-03 | Discharge: 2023-12-03 | Disposition: A | Discharge status: Home / Self Care | Source: Ambulatory Visit | Attending: Internal Medicine | Admitting: Internal Medicine

## 2023-12-03 ENCOUNTER — Encounter: Payer: Self-pay | Admitting: Certified Nurse Midwife

## 2023-12-03 ENCOUNTER — Ambulatory Visit (INDEPENDENT_AMBULATORY_CARE_PROVIDER_SITE_OTHER): Admitting: Certified Nurse Midwife

## 2023-12-03 VITALS — BP 105/73 | HR 65 | Ht 61.0 in | Wt 110.6 lb

## 2023-12-03 DIAGNOSIS — Z1151 Encounter for screening for human papillomavirus (HPV): Secondary | ICD-10-CM

## 2023-12-03 DIAGNOSIS — Z124 Encounter for screening for malignant neoplasm of cervix: Secondary | ICD-10-CM | POA: Diagnosis present

## 2023-12-03 DIAGNOSIS — Z01419 Encounter for gynecological examination (general) (routine) without abnormal findings: Secondary | ICD-10-CM

## 2023-12-03 DIAGNOSIS — Z1231 Encounter for screening mammogram for malignant neoplasm of breast: Secondary | ICD-10-CM | POA: Insufficient documentation

## 2023-12-03 NOTE — Patient Instructions (Signed)

## 2023-12-03 NOTE — Progress Notes (Signed)
 ANNUAL EXAM Patient name: Yvonne Evans MRN [id number]  Date of birth: 1981-04-05 Chief Complaint:   Gynecologic Exam  History of Present Illness:   Yvonne Evans is a 43 y.o. G45P3003 Caucasian female being seen today for a routine annual exam.  Current complaints: weight gain of ~5lb despite usual diet & activity levels.  Patient's last menstrual period was 11/17/2023 (exact date).   The pregnancy intention screening data noted above was reviewed. Potential methods of contraception were discussed. The patient elected to proceed with Vasectomy.      Component Value Date/Time   DIAGPAP  09/15/2022 1409    - Negative for intraepithelial lesion or malignancy (NILM)   HPVHIGH Negative 09/15/2022 1409   ADEQPAP  09/15/2022 1409    Satisfactory for evaluation; transformation zone component PRESENT.       Last pap 08/2022. Results were: NILM w/ HRHPV negative. H/O abnormal pap: no Last mammogram: 2024 (done today). Results were: normal. Family h/o breast cancer: no Last colonoscopy: n/a. Results were: N/A. Family h/o colorectal cancer: no     12/03/2023    4:47 PM  Depression screen PHQ 2/9  Decreased Interest 0  Down, Depressed, Hopeless 0  PHQ - 2 Score 0         No data to display           Review of Systems:   Pertinent items are noted in HPI Denies any headaches, blurred vision, fatigue, shortness of breath, chest pain, abdominal pain, abnormal vaginal discharge/itching/odor/irritation, problems with periods, bowel movements, urination, or intercourse unless otherwise stated above. Pertinent History Reviewed:  Reviewed past medical,surgical, social and family history.  Reviewed problem list, medications and allergies. Physical Assessment:   Vitals:   12/03/23 1326  BP: 105/73  Pulse: 65  Weight: 110 lb 9.6 oz (50.2 kg)  Height: 5\' 1"  (1.549 m)  Body mass index is 20.9 kg/m.       Physical Exam Vitals reviewed.  Constitutional:      Appearance:  Normal appearance.  HENT:     Head: Normocephalic.  Neck:     Thyroid: No thyroid mass or thyromegaly.  Cardiovascular:     Rate and Rhythm: Normal rate and regular rhythm.     Heart sounds: Normal heart sounds.  Pulmonary:     Effort: Pulmonary effort is normal.     Breath sounds: Normal breath sounds.  Chest:  Breasts:    Tanner Score is 5.     Right: Normal.     Left: Normal.  Abdominal:     General: Abdomen is flat.     Palpations: Abdomen is soft.     Tenderness: There is no abdominal tenderness.  Genitourinary:    General: Normal vulva.     Tanner stage (genital): 5.     Vagina: Normal.     Cervix: Friability present. No cervical motion tenderness.     Uterus: Not enlarged, not fixed and not tender.      Adnexa:        Right: No mass or tenderness.         Left: No mass or tenderness.       Comments: Cervical polyp at 12o'clock Musculoskeletal:     Cervical back: Neck supple. No tenderness.  Skin:    General: Skin is warm and dry.  Neurological:     General: No focal deficit present.     Mental Status: She is alert and oriented to person, place, and time.  Psychiatric:  Mood and Affect: Mood normal.        Behavior: Behavior normal.      No results found for this or any previous visit (from the past 24 hours).  Assessment & Plan:  1. Well woman exam (Primary)  2. Cervical cancer screening - Cytology - PAP  3. Encounter for screening for human papillomavirus (HPV) - Cytology - PAP   Mammogram:  completed today Colonoscopy: @ 42yo, or sooner if problems  No orders of the defined types were placed in this encounter.   Meds: No orders of the defined types were placed in this encounter.   Follow-up: Return in 1 year (on 12/02/2024) for Annual exam.  Dominica Severin, CNM 12/03/2023 4:48 PM

## 2023-12-10 ENCOUNTER — Encounter: Payer: Self-pay | Admitting: Certified Nurse Midwife

## 2023-12-10 LAB — CYTOLOGY - PAP
Comment: NEGATIVE
Diagnosis: NEGATIVE
High risk HPV: NEGATIVE

## 2024-03-06 ENCOUNTER — Other Ambulatory Visit: Payer: Self-pay

## 2024-03-06 ENCOUNTER — Emergency Department

## 2024-03-06 ENCOUNTER — Emergency Department
Admission: EM | Admit: 2024-03-06 | Discharge: 2024-03-06 | Disposition: A | Discharge status: Home / Self Care | Attending: Emergency Medicine | Admitting: Emergency Medicine

## 2024-03-06 DIAGNOSIS — M79605 Pain in left leg: Secondary | ICD-10-CM | POA: Insufficient documentation

## 2024-03-06 LAB — BASIC METABOLIC PANEL WITH GFR
Anion gap: 9 (ref 5–15)
BUN: 11 mg/dL (ref 6–20)
CO2: 25 mmol/L (ref 22–32)
Calcium: 9.3 mg/dL (ref 8.9–10.3)
Chloride: 103 mmol/L (ref 98–111)
Creatinine, Ser: 0.78 mg/dL (ref 0.44–1.00)
GFR, Estimated: >60 mL/min (ref >60)
Glucose, Bld: 92 mg/dL (ref 70–99)
Potassium: 3.5 mmol/L (ref 3.5–5.1)
Sodium: 137 mmol/L (ref 135–145)

## 2024-03-06 LAB — CBC WITH DIFFERENTIAL/PLATELET
Abs Immature Granulocytes: 0.02 K/uL (ref 0.00–0.07)
Basophils Absolute: 0 K/uL (ref 0.0–0.1)
Basophils Relative: 1 %
Eosinophils Absolute: 0.1 K/uL (ref 0.0–0.5)
Eosinophils Relative: 1 %
HCT: 41.3 % (ref 36.0–46.0)
Hemoglobin: 14.5 g/dL (ref 12.0–15.0)
Immature Granulocytes: 0 %
Lymphocytes Relative: 29 %
Lymphs Abs: 1.8 K/uL (ref 0.7–4.0)
MCH: 32.3 pg (ref 26.0–34.0)
MCHC: 35.1 g/dL (ref 30.0–36.0)
MCV: 92 fL (ref 80.0–100.0)
Monocytes Absolute: 0.5 K/uL (ref 0.1–1.0)
Monocytes Relative: 8 %
Neutro Abs: 3.6 K/uL (ref 1.7–7.7)
Neutrophils Relative %: 61 %
Platelets: 203 K/uL (ref 150–400)
RBC: 4.49 MIL/uL (ref 3.87–5.11)
RDW: 12.3 % (ref 11.5–15.5)
WBC: 6 K/uL (ref 4.0–10.5)
nRBC: 0 % (ref 0.0–0.2)

## 2024-03-06 LAB — HCG, QUANTITATIVE, PREGNANCY: hCG, Beta Chain, Quant, S: <1 m[IU]/mL (ref <5)

## 2024-03-06 LAB — CK: Total CK: 76 U/L (ref 38–234)

## 2024-03-06 LAB — MAGNESIUM: Magnesium: 2 mg/dL (ref 1.7–2.4)

## 2024-03-06 NOTE — ED Triage Notes (Signed)
 Pt in POV for waking to l leg pain, pain is radiating from below calf when she woke that is radiating to above the knee. Hx superficial blood clots, - thinners, but took 3 81 mg ASA PTA.

## 2024-03-06 NOTE — Discharge Instructions (Signed)
 You may alternate over the counter Tylenol 1000 mg every 6 hours as needed for pain, fever and Ibuprofen 800 mg every 6-8 hours as needed for pain, fever.  Please take Ibuprofen with food.  Do not take more than 4000 mg of Tylenol (acetaminophen) in a 24 hour period.

## 2024-03-06 NOTE — ED Provider Notes (Signed)
 [facility] Provider Note    Event Date/Time   First MD Initiated Contact with Patient 03/06/24 [phone]     (approximate)   History   Leg Pain   HPI  Yvonne Evans is a 43 y.o. 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  She describes as a crampy pain in her left posterior calf that has now moved to her medial thigh.  She does report having a family history of DVTs which made her concerned.  She has never been on any blood thinners.  No chest pain or shortness of breath.  No injury to her leg.  No increased physical exertion.  She has not been outside working in the heat and no vomiting or diarrhea.  States that she started having pain with putting any weight on this leg.  Reports taking 3 aspirin at home prior to arrival.   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   History provided by patient.    No past medical history on file.  Past Surgical History:  Procedure Laterality Date   APPENDECTOMY     CESAREAN SECTION     DILATION AND CURETTAGE OF UTERUS     TONSILLECTOMY      MEDICATIONS:  Prior to Admission medications   Medication Sig Start Date End Date Taking? Authorizing Provider  prochlorperazine (COMPAZINE) 5 MG tablet  02/09/18   [provider]  Rimegepant Sulfate (NURTEC) 75 MG TBDP Take by mouth.    [provider]  Vitamin D , Ergocalciferol , (DRISDOL ) 1.25 MG (50000 UNIT) CAPS capsule Take 1 capsule (50,000 Units total) by mouth every 7 (seven) days. 09/16/22   Starla Harland BROCKS, MD    Physical Exam   Triage Vital Signs: ED Triage Vitals [03/06/24 0535]  Encounter Vitals Group     BP 122/85     Girls Systolic BP Percentile      Girls  Diastolic BP Percentile      Boys Systolic BP Percentile      Boys Diastolic BP Percentile      Pulse Rate 80     Resp 18     Temp 98.7 F (37.1 C)     Temp Source Oral     SpO2 100 %     Weight 106 lb (48.1 kg)     Height 5' 1 (1.549 m)     Head Circumference      Peak Flow      Pain Score 4     Pain Loc      Pain Education      Exclude from Growth Chart     Most recent vital signs: Vitals:   03/06/24 0535  BP: 122/85  Pulse: 80  Resp: 18  Temp: 98.7 F (37.1 C)  SpO2: 100%    CONSTITUTIONAL: Alert, responds appropriately to questions. Well-appearing; well-nourished HEAD: Normocephalic, atraumatic EYES: Conjunctivae clear, pupils appear equal, sclera nonicteric ENT: normal nose; moist mucous membranes NECK: Supple, normal ROM CARD: RRR; S1 and S2 appreciated RESP: Normal chest excursion without splinting or tachypnea; breath sounds clear and equal bilaterally; no wheezes, no rhonchi, no rales, no hypoxia or respiratory distress, speaking full sentences ABD/GI: Non-distended; soft, non-tender, no rebound, no guarding, no peritoneal signs  BACK: The back appears normal EXT: Normal ROM in all joints; no deformity noted, no edema, tenderness to palpation over the left calf and medial thigh without overlying skin changes.  No increased warmth or redness.  No soft tissue swelling, edema.  No joint effusion.  2+ left DP pulse.  Compartments in the left leg are soft. SKIN: Normal color for age and race; warm; no rash on exposed skin NEURO: Moves all extremities equally, normal speech, ambulates with normal gait PSYCH: The patient's mood and manner are appropriate.   ED Results / Procedures / Treatments   LABS: (all labs ordered are listed, but only abnormal results are displayed) Labs Reviewed  CBC WITH DIFFERENTIAL/PLATELET  BASIC METABOLIC PANEL WITH GFR  MAGNESIUM  HCG, QUANTITATIVE, PREGNANCY  CK     EKG:  EKG Interpretation Date/Time:    Ventricular Rate:     PR Interval:    QRS Duration:    QT Interval:    QTC Calculation:   R Axis:      Text Interpretation:           RADIOLOGY: My personal review and interpretation of imaging: No DVT.  I have personally reviewed all radiology reports.   US  Venous Img Lower Unilateral Left Result Date: 03/06/2024 CLINICAL DATA:  Left lower extremity pain. EXAM: LEFT LOWER EXTREMITY VENOUS DOPPLER ULTRASOUND TECHNIQUE: Gray-scale sonography with compression, as well as color and duplex ultrasound, were performed to evaluate the deep venous system(s) from the level of the common femoral vein through the popliteal and proximal calf veins. COMPARISON:  None. FINDINGS: VENOUS Normal compressibility of the common femoral, superficial femoral, and popliteal veins, as well as the visualized calf veins. Visualized portions of profunda femoral vein and great saphenous vein unremarkable. No filling defects to suggest DVT on grayscale or color Doppler imaging. Doppler waveforms show normal direction of venous flow, normal respiratory plasticity and response to augmentation. Limited views of the contralateral common femoral vein are unremarkable. OTHER None. Limitations: none IMPRESSION: Negative. Electronically Signed   By: Francis Quam M.D.   On: 03/06/2024 06:28     PROCEDURES:  Critical Care performed: No    Procedures    IMPRESSION / MDM / ASSESSMENT AND PLAN / ED COURSE  I reviewed the triage vital signs and the nursing notes.    Patient here with left leg cramping.     DIFFERENTIAL DIAGNOSIS (includes but not limited to):   Electrolyte derangements, muscle strain, muscle spasm, DVT, superficial thrombophlebitis, no signs of cellulitis or compartment syndrome, doubt septic arthritis or gout, doubt fracture   Patient's presentation is most consistent with acute presentation with potential threat to life or bodily function.   PLAN: Will obtain venous Doppler of the left leg.  Will check  electrolytes, CK.  Reports taking aspirin prior to arrival.  She declines anything else for pain.  No chest pain or shortness of breath.  No injury to the leg.   MEDICATIONS GIVEN IN ED: Medications - No data to display   ED COURSE: Labs show no leukocytosis, normal hemoglobin.  Normal electrolytes, CK.  Ultrasound reviewed and interpreted by myself and the radiologist and shows no signs of superficial thrombophlebitis or DVT.  Patient is very reassured.  We discussed that this could be muscle spasms, muscle cramps, muscle strain and recommended alternating heat, ice, rest, massage, alternating Tylenol and ibuprofen.  Low suspicion for fracture given no injury and no bony tenderness on exam.  She has no signs of cellulitis and  no clinical signs of superficial thrombophlebitis on my exam.  I do not think she has an arterial obstruction as she has a strong pulse and normal capillary refill.  There is no sign of compartment syndrome, gout, septic arthritis either.  She verbalized understanding and has a PCP for close follow-up if symptoms not improving.  We discussed return precautions.  At this time, I do not feel there is any life-threatening condition present. I reviewed all nursing notes, vitals, pertinent previous records.  All lab and urine results, EKGs, imaging ordered have been independently reviewed and interpreted by myself.  I reviewed all available radiology reports from any imaging ordered this visit.  Based on my assessment, I feel the patient is safe to be discharged home without further emergent workup and can continue workup as an outpatient as needed. Discussed all findings, treatment plan as well as usual and customary return precautions.  They verbalize understanding and are comfortable with this plan.  Outpatient follow-up has been provided as needed.  All questions have been answered.  CONSULTS:  none   OUTSIDE RECORDS REVIEWED: Reviewed prior notes at [facility] in  2024.       FINAL CLINICAL IMPRESSION(S) / ED DIAGNOSES   Final diagnoses:  Left leg pain     Rx / DC Orders   ED Discharge Orders     None        Note:  This document was prepared using Dragon voice recognition software and may include unintentional dictation errors.   Fabien Travelstead, Josette SAILOR, DO 03/06/24 [phone]
